# Patient Record
Sex: Male | Born: 1995 | Race: Black or African American | Hispanic: No | Marital: Single | Smoking: Never smoker
Health system: Southern US, Community
[De-identification: ages and names within clinical notes are randomized; demographics above are authoritative.]

## PROBLEM LIST (undated history)

## (undated) HISTORY — PX: ANTERIOR CRUCIATE LIGAMENT REPAIR: SHX115

---

## 2017-11-04 ENCOUNTER — Other Ambulatory Visit: Payer: Self-pay

## 2017-11-04 ENCOUNTER — Encounter (HOSPITAL_COMMUNITY): Payer: Self-pay | Admitting: Emergency Medicine

## 2017-11-04 ENCOUNTER — Ambulatory Visit (HOSPITAL_COMMUNITY)
Admission: EM | Admit: 2017-11-04 | Discharge: 2017-11-04 | Disposition: A | Discharge status: Home / Self Care | Payer: BLUE CROSS/BLUE SHIELD | Attending: Emergency Medicine | Admitting: Emergency Medicine

## 2017-11-04 DIAGNOSIS — Z202 Contact with and (suspected) exposure to infections with a predominantly sexual mode of transmission: Secondary | ICD-10-CM | POA: Diagnosis not present

## 2017-11-04 DIAGNOSIS — Z113 Encounter for screening for infections with a predominantly sexual mode of transmission: Secondary | ICD-10-CM

## 2017-11-04 LAB — POCT URINALYSIS DIP (DEVICE)
Bilirubin Urine: NEGATIVE
GLUCOSE, UA: NEGATIVE mg/dL
Ketones, ur: NEGATIVE mg/dL
NITRITE: NEGATIVE
Protein, ur: 30 mg/dL — AB
Specific Gravity, Urine: 1.02 (ref 1.005–1.030)
UROBILINOGEN UA: 2 mg/dL — AB (ref 0.0–1.0)
pH: 7 (ref 5.0–8.0)

## 2017-11-04 MED ORDER — AZITHROMYCIN 250 MG PO TABS
ORAL_TABLET | ORAL | Status: AC
  Filled 2017-11-04: qty 4

## 2017-11-04 MED ORDER — AZITHROMYCIN 250 MG PO TABS
1000.0000 mg | ORAL_TABLET | Freq: Once | ORAL | Status: AC
  Administered 2017-11-04: 1000 mg via ORAL

## 2017-11-04 NOTE — ED Provider Notes (Signed)
[facility]    CSN: [id number] Arrival date & time: 11/04/17  1556     History   Chief Complaint Chief Complaint  Patient presents with  . Exposure to STD    HPI Antonio Carroll is a 22 y.o. male presenting with concern over exposure to STD. A partner tested positive for chlamydia. Denies symptoms- penile discharge, rashes, lesions, dysuria, scrotal/testicular swelling or pain. No previous history of STD.  HPI  History reviewed. No pertinent past medical history.  There are no active problems to display for this patient.   History reviewed. No pertinent surgical history.     Home Medications    Prior to Admission medications   Not on File    Family History Family History  Family history unknown: Yes    Social History Social History   Tobacco Use  . Smoking status: Never Smoker  Substance Use Topics  . Alcohol use: Yes  . Drug use: Not on file     Allergies   Patient has no known allergies.   Review of Systems Review of Systems  Constitutional: Negative for fever.  HENT: Negative for sore throat.   Respiratory: Negative for shortness of breath.   Cardiovascular: Negative for chest pain.  Gastrointestinal: Negative for abdominal pain, nausea and vomiting.  Genitourinary: Negative for difficulty urinating, discharge, dysuria, frequency, penile pain, penile swelling, scrotal swelling and testicular pain.  Skin: Negative for rash.  Neurological: Negative for dizziness, light-headedness and headaches.     Physical Exam Triage Vital Signs ED Triage Vitals [11/04/17 1607]  Enc Vitals Group     BP (!) 117/56     Pulse Rate 69     Resp 18     Temp 98 F (36.7 C)     Temp src      SpO2 100 %     Weight      Height      Head Circumference      Peak Flow      Pain Score      Pain Loc      Pain Edu?      Excl. in GC?    No data found.  Updated Vital Signs BP (!) 117/56   Pulse 69   Temp 98 F (36.7 C)   Resp 18   SpO2 100%     Physical Exam  Constitutional: He is oriented to person, place, and time. He appears well-developed and well-nourished.  HENT:  Head: Normocephalic and atraumatic.  Eyes: Conjunctivae are normal.  Neck: Neck supple.  Cardiovascular: Normal rate.  Pulmonary/Chest: Effort normal. No respiratory distress.  Abdominal: He exhibits no distension.  Musculoskeletal: He exhibits no edema.  Neurological: He is alert and oriented to person, place, and time.  Skin: Skin is warm and dry.  Psychiatric: He has a normal mood and affect.  Nursing note and vitals reviewed.    UC Treatments / Results  Labs (all labs ordered are listed, but only abnormal results are displayed) Labs Reviewed  POCT URINALYSIS DIP (DEVICE)  URINE CYTOLOGY ANCILLARY ONLY    EKG  EKG Interpretation None       Radiology No results found.  Procedures Procedures (including critical care time)  Medications Ordered in UC Medications  azithromycin (ZITHROMAX) tablet 1,000 mg (not administered)     Initial Impression / Assessment and Plan / UC Course  I have reviewed the triage vital signs and the nursing notes.  Pertinent labs & imaging results that were available during  my care of the patient were reviewed by me and considered in my medical decision making (see chart for details).     Patient with exposure to chlamydia. Screening for STD's performed, will call to inform of positive results. Patient requesting empiric treatment today, chlamydia only; azithromycin given.    Discussed strict return precautions. Patient verbalized understanding and is agreeable with plan.    Final Clinical Impressions(s) / UC Diagnoses   Final diagnoses:  STD exposure    ED Discharge Orders    None       Controlled Substance Prescriptions  Controlled Substance Registry consulted? Not Applicable   Lew Dawes, [place] 11/04/17 1703

## 2017-11-04 NOTE — ED Notes (Signed)
Clean and dirty urine specimen obtained, specimens in lab

## 2017-11-04 NOTE — Discharge Instructions (Signed)
We have treated you today for chlamydia, with azithromycin. Please refrain from sexual activity for 7 days while medicine is clearing infection. ° °We are testing you for Gonorrhea, Chlamydia and Trichomonas. We will call you if anything is positive and let you know if you require any further treatment. Please inform partner of any positive results. ° °Please return if symptoms not improving with treatment, development of fever, nausea, vomiting, abdominal pain, scrotal pain. °

## 2017-11-04 NOTE — ED Triage Notes (Signed)
Pt states he was with someone who stated they had chlamydia. Denies symptoms.

## 2017-11-07 LAB — URINE CYTOLOGY ANCILLARY ONLY
Chlamydia: NEGATIVE
Neisseria Gonorrhea: NEGATIVE
TRICH (WINDOWPATH): NEGATIVE

## 2018-08-23 ENCOUNTER — Emergency Department (HOSPITAL_COMMUNITY)
Admission: EM | Admit: 2018-08-23 | Discharge: 2018-08-23 | Disposition: A | Discharge status: Home / Self Care | Payer: BLUE CROSS/BLUE SHIELD | Attending: Emergency Medicine | Admitting: Emergency Medicine

## 2018-08-23 ENCOUNTER — Emergency Department (HOSPITAL_COMMUNITY): Payer: BLUE CROSS/BLUE SHIELD

## 2018-08-23 ENCOUNTER — Encounter (HOSPITAL_COMMUNITY): Payer: Self-pay | Admitting: *Deleted

## 2018-08-23 DIAGNOSIS — Y92310 Basketball court as the place of occurrence of the external cause: Secondary | ICD-10-CM | POA: Insufficient documentation

## 2018-08-23 DIAGNOSIS — X500XXA Overexertion from strenuous movement or load, initial encounter: Secondary | ICD-10-CM | POA: Diagnosis not present

## 2018-08-23 DIAGNOSIS — S8991XA Unspecified injury of right lower leg, initial encounter: Secondary | ICD-10-CM | POA: Insufficient documentation

## 2018-08-23 DIAGNOSIS — Y9367 Activity, basketball: Secondary | ICD-10-CM | POA: Insufficient documentation

## 2018-08-23 DIAGNOSIS — Y998 Other external cause status: Secondary | ICD-10-CM | POA: Diagnosis not present

## 2018-08-23 DIAGNOSIS — M25561 Pain in right knee: Secondary | ICD-10-CM

## 2018-08-23 MED ORDER — ACETAMINOPHEN 500 MG PO TABS: 500.0000 mg | ORAL_TABLET | Freq: Four times a day (QID) | ORAL | 0 refills | Status: AC | PRN

## 2018-08-23 MED ORDER — IBUPROFEN 600 MG PO TABS: 600.0000 mg | ORAL_TABLET | Freq: Four times a day (QID) | ORAL | 0 refills | Status: AC | PRN

## 2018-08-23 NOTE — ED Triage Notes (Signed)
Pt complains of right knee pain since twisting his knee while playing basketball this afternoon.

## 2018-08-23 NOTE — ED Provider Notes (Signed)
[facility]-EMERGENCY DEPT Provider Note   CSN: [id number] Arrival date & time: 08/23/18  1848     History   Chief Complaint Chief Complaint  Patient presents with  . Knee Injury    HPI Antonio Carroll is a 22 y.o. male who is previously healthy who presents with right knee pain. He was play basketball and he thinks it twisted inward. He initially had some tingling to his knee, which has resolved. He has not been able to bear without significant pain since. He did not take any interventions prior to arrival.   HPI  History reviewed. No pertinent past medical history.  There are no active problems to display for this patient.   History reviewed. No pertinent surgical history.      Home Medications    Prior to Admission medications   Medication Sig Start Date End Date Taking? Authorizing Provider  acetaminophen (TYLENOL) 500 MG tablet Take 1 tablet (500 mg total) by mouth every 6 (six) hours as needed. 08/23/18   Avis Mcmahill, Waylan Boga, PA-C  ibuprofen (ADVIL,MOTRIN) 600 MG tablet Take 1 tablet (600 mg total) by mouth every 6 (six) hours as needed. 08/23/18   Emi Holes, PA-C    Family History Family History  Family history unknown: Yes    Social History Social History   Tobacco Use  . Smoking status: Never Smoker  Substance Use Topics  . Alcohol use: Yes  . Drug use: Not on file     Allergies   Patient has no known allergies.   Review of Systems Review of Systems  Constitutional: Negative for fever.  Musculoskeletal: Positive for arthralgias.  Neurological: Negative for numbness.     Physical Exam Updated Vital Signs BP 125/69 (BP Location: Right Arm)   Pulse (!) 57   Temp 98.3 F (36.8 C) (Oral)   Resp 18   SpO2 96%   Physical Exam  Constitutional: He appears well-developed and well-nourished. No distress.  HENT:  Head: Normocephalic and atraumatic.  Mouth/Throat: Oropharynx is clear and moist. No oropharyngeal exudate.   Eyes: Pupils are equal, round, and reactive to light. Conjunctivae are normal. Right eye exhibits no discharge. Left eye exhibits no discharge. No scleral icterus.  Neck: Normal range of motion. Neck supple. No thyromegaly present.  Cardiovascular: Normal rate, regular rhythm, normal heart sounds and intact distal pulses. Exam reveals no gallop and no friction rub.  No murmur heard. Pulmonary/Chest: Effort normal and breath sounds normal. No stridor. No respiratory distress. He has no wheezes. He has no rales.  Musculoskeletal: He exhibits no edema.  R knee: Tenderness over the patella and patellar tendon, SLR intact, negative anterior/posterior drawer, no laxity with varus and valgus stress, mild edema over prepatellar space, no warmth or erythema, 5/5 strength with flexion and extension  Lymphadenopathy:    He has no cervical adenopathy.  Neurological: He is alert. Coordination normal.  Skin: Skin is warm and dry. No rash noted. He is not diaphoretic. No pallor.  Psychiatric: He has a normal mood and affect.  Nursing note and vitals reviewed.    ED Treatments / Results  Labs (all labs ordered are listed, but only abnormal results are displayed) Labs Reviewed - No data to display  EKG None  Radiology Dg Knee Complete 4 Views Right  Result Date: 08/23/2018 CLINICAL DATA:  Pt complains of right knee pain since twisting his knee while playing basketball this afternoon. EXAM: RIGHT KNEE - COMPLETE 4+ VIEW COMPARISON:  None. FINDINGS: No  acute fracture or dislocation. Limited evaluation for joint effusion, likely due to patient size and overlying soft tissues. Joint spaces are maintained. IMPRESSION: No acute osseous abnormality. Electronically Signed   By: Jeronimo Greaves M.D.   On: 08/23/2018 19:44    Procedures Procedures (including critical care time)  Medications Ordered in ED Medications - No data to display   Initial Impression / Assessment and Plan / ED Course  I have  reviewed the triage vital signs and the nursing notes.  Pertinent labs & imaging results that were available during my care of the patient were reviewed by me and considered in my medical decision making (see chart for details).     Patient presenting with R knee pain after twisting it while playing basketball. X-ray is negative for acute injury. The joint appears stable. Patellar tendon appears intact. Will refer to orthopedics for further evaluation for probably soft tissue injury. Supportive treatment discussed including ice, elevation, ibuprofen, Tylenol. ACE wrap and crutches provided in the ED. Return precautions discussed. Patient understands and agrees with plan. Patient vitals stable throughout ED course and discharged in satisfactory condition.  Final Clinical Impressions(s) / ED Diagnoses   Final diagnoses:  Acute pain of right knee    ED Discharge Orders         Ordered    ibuprofen (ADVIL,MOTRIN) 600 MG tablet  Every 6 hours PRN     08/23/18 2056    acetaminophen (TYLENOL) 500 MG tablet  Every 6 hours PRN     08/23/18 2056           Emi Holes, PA-C 08/23/18 2308    Arby Barrette, MD 08/24/18 1255

## 2018-08-23 NOTE — Discharge Instructions (Addendum)
Use ice 3-4 times daily alternating 20 minutes on, 20 minutes off.  Please elevate your leg whenever you are not walking on it.  Take ibuprofen and Tylenol as prescribed, as needed for your pain.  Please follow-up with the orthopedic doctor for further evaluation and treatment of your knee pain, as you may have a soft tissue injury that was not seen on x-ray today.

## 2019-11-15 IMAGING — CR DG KNEE COMPLETE 4+V*R*
4 series · 4 of 4 positions shown · non-contrast
Comparison: None.

CLINICAL DATA: Pt complains of right knee pain since twisting his
knee while playing basketball this afternoon.

EXAM:
RIGHT KNEE - COMPLETE 4+ VIEW

[t knee ap right]
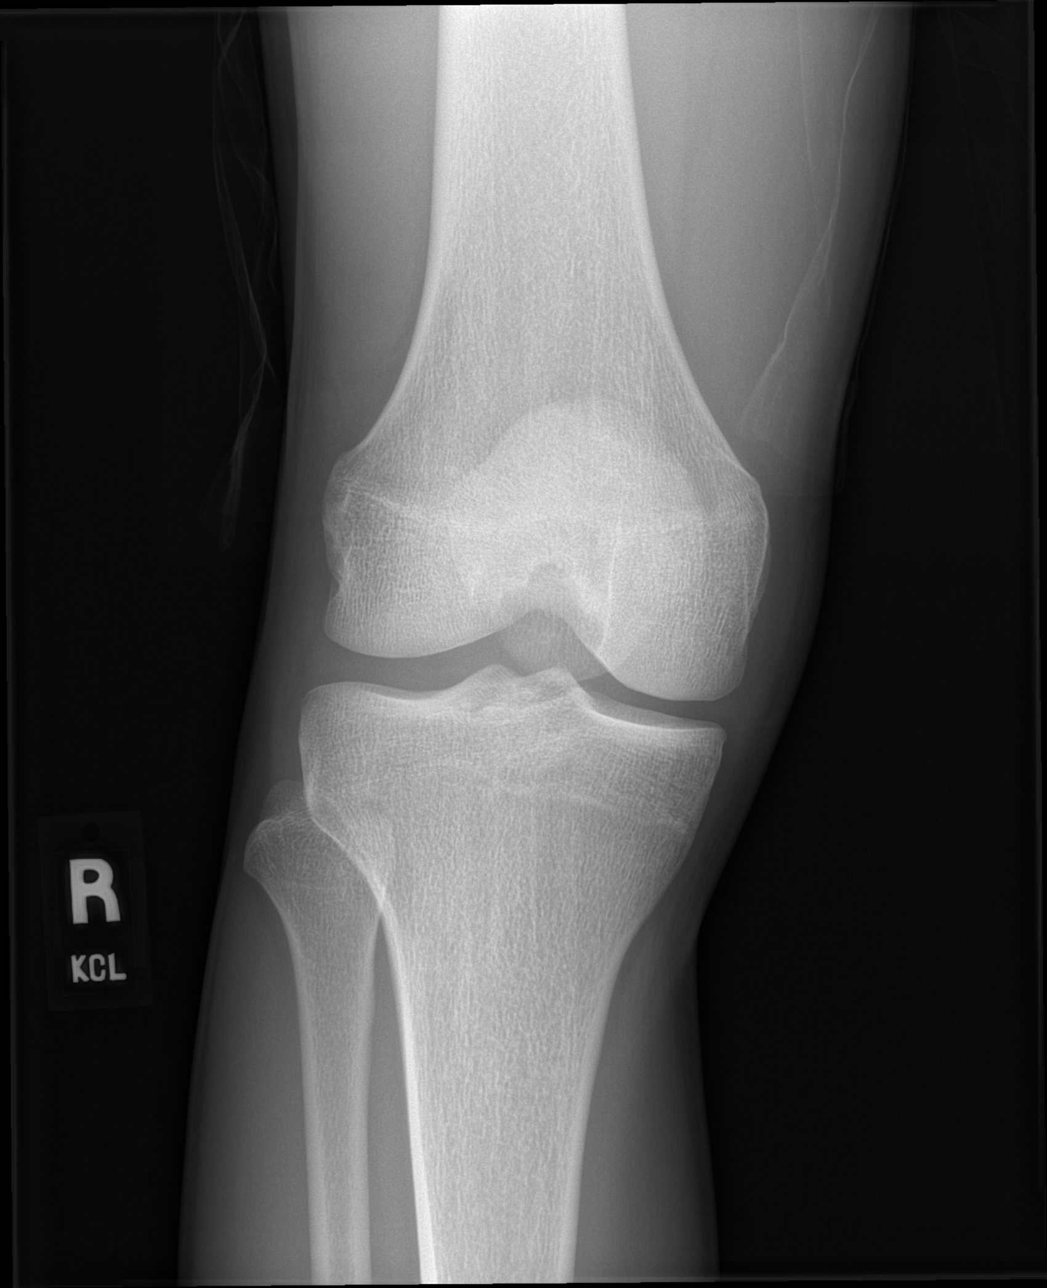

[t knee obl right (1 of 2)]
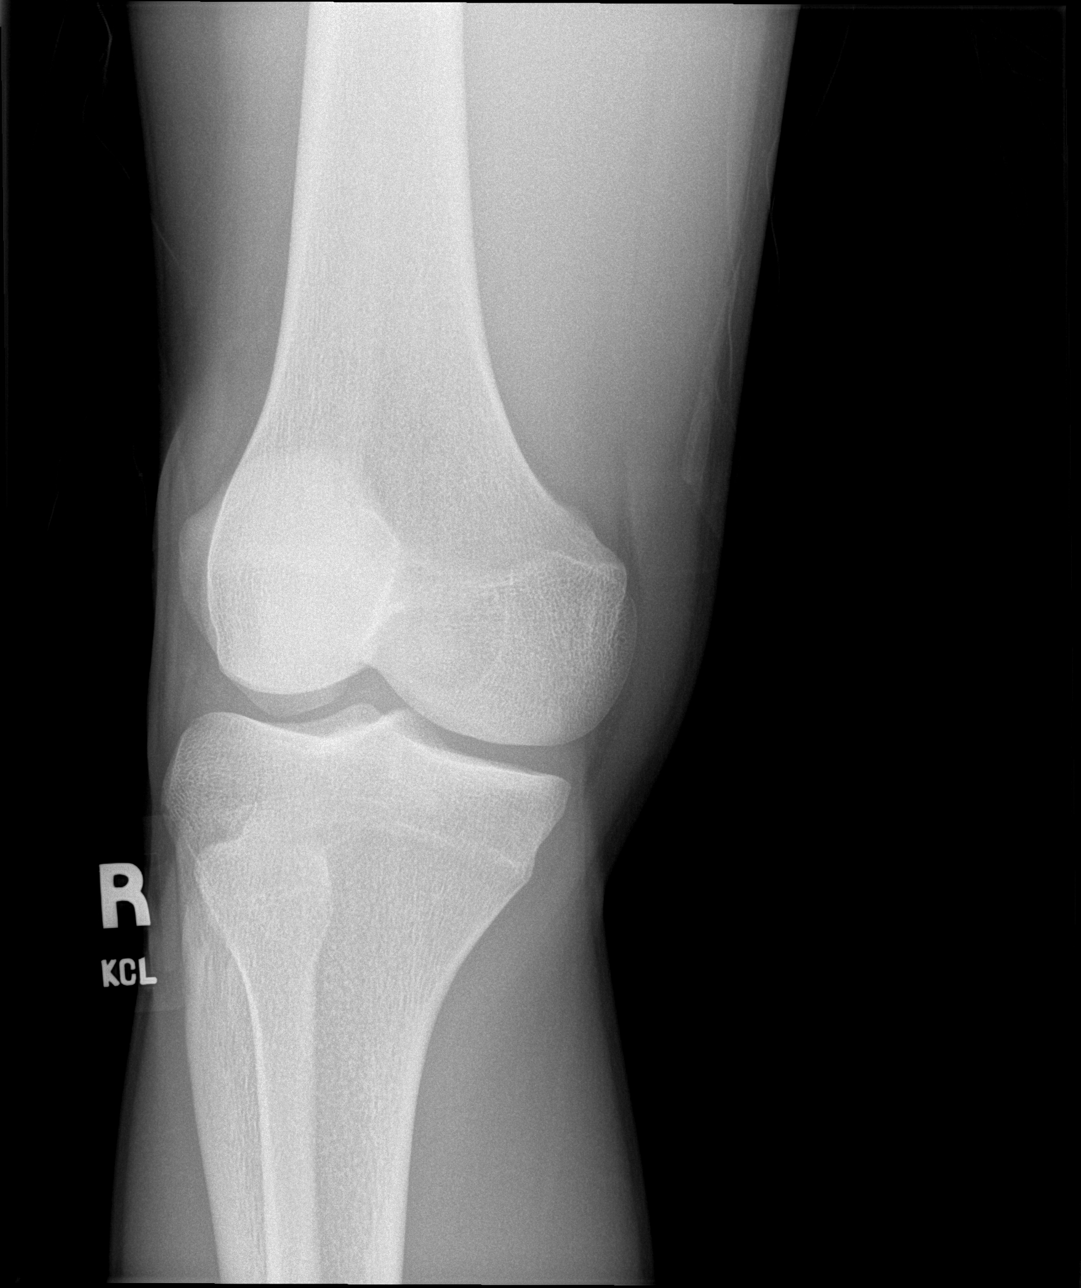

[t knee obl right (2 of 2)]
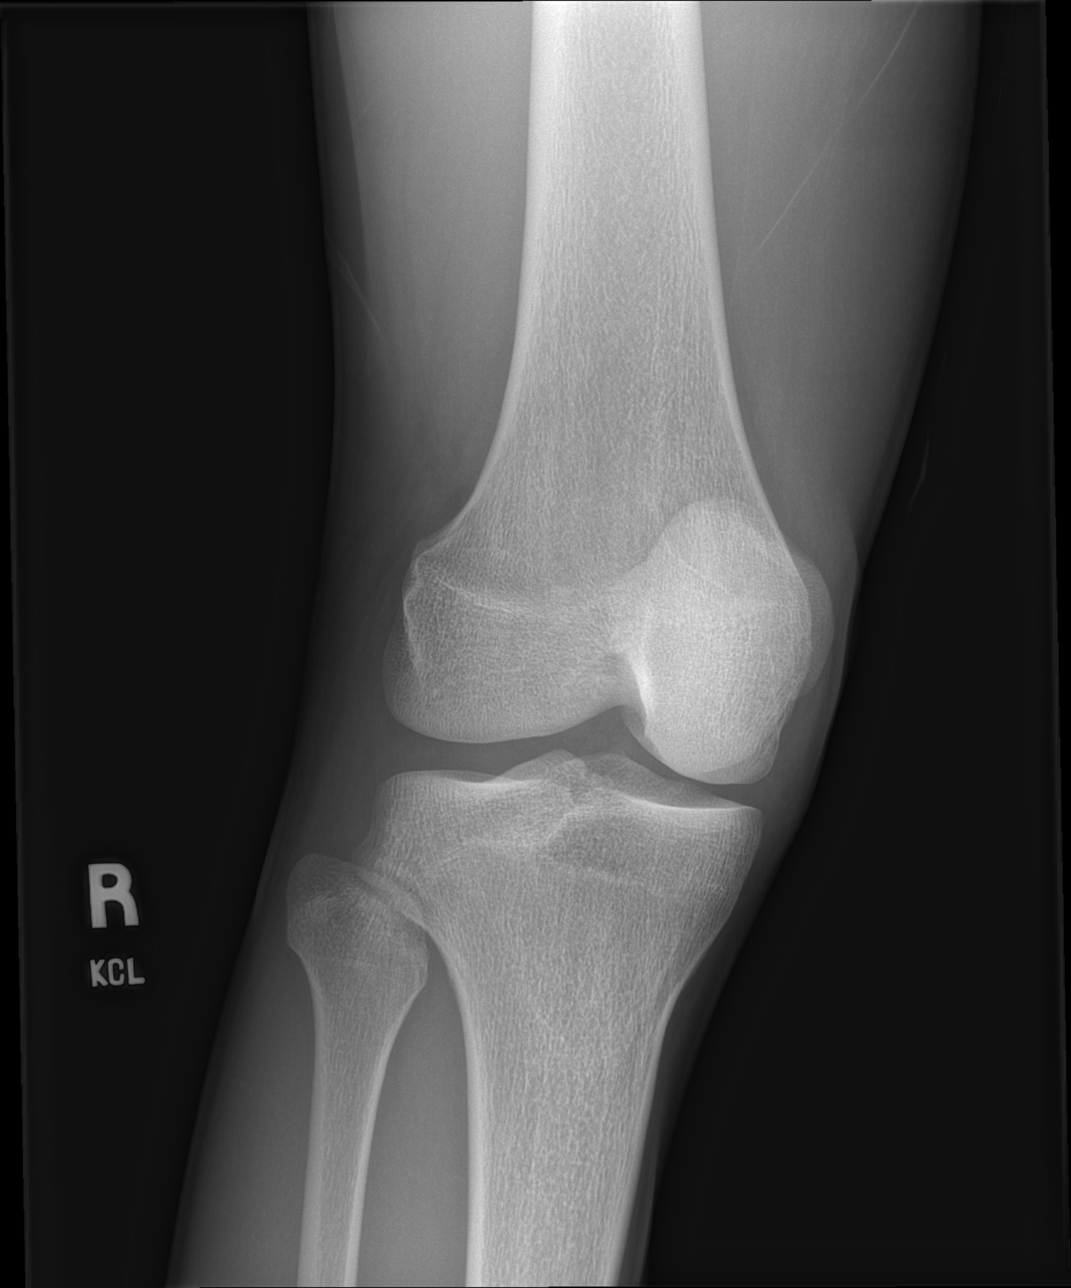

[t knee lat right]
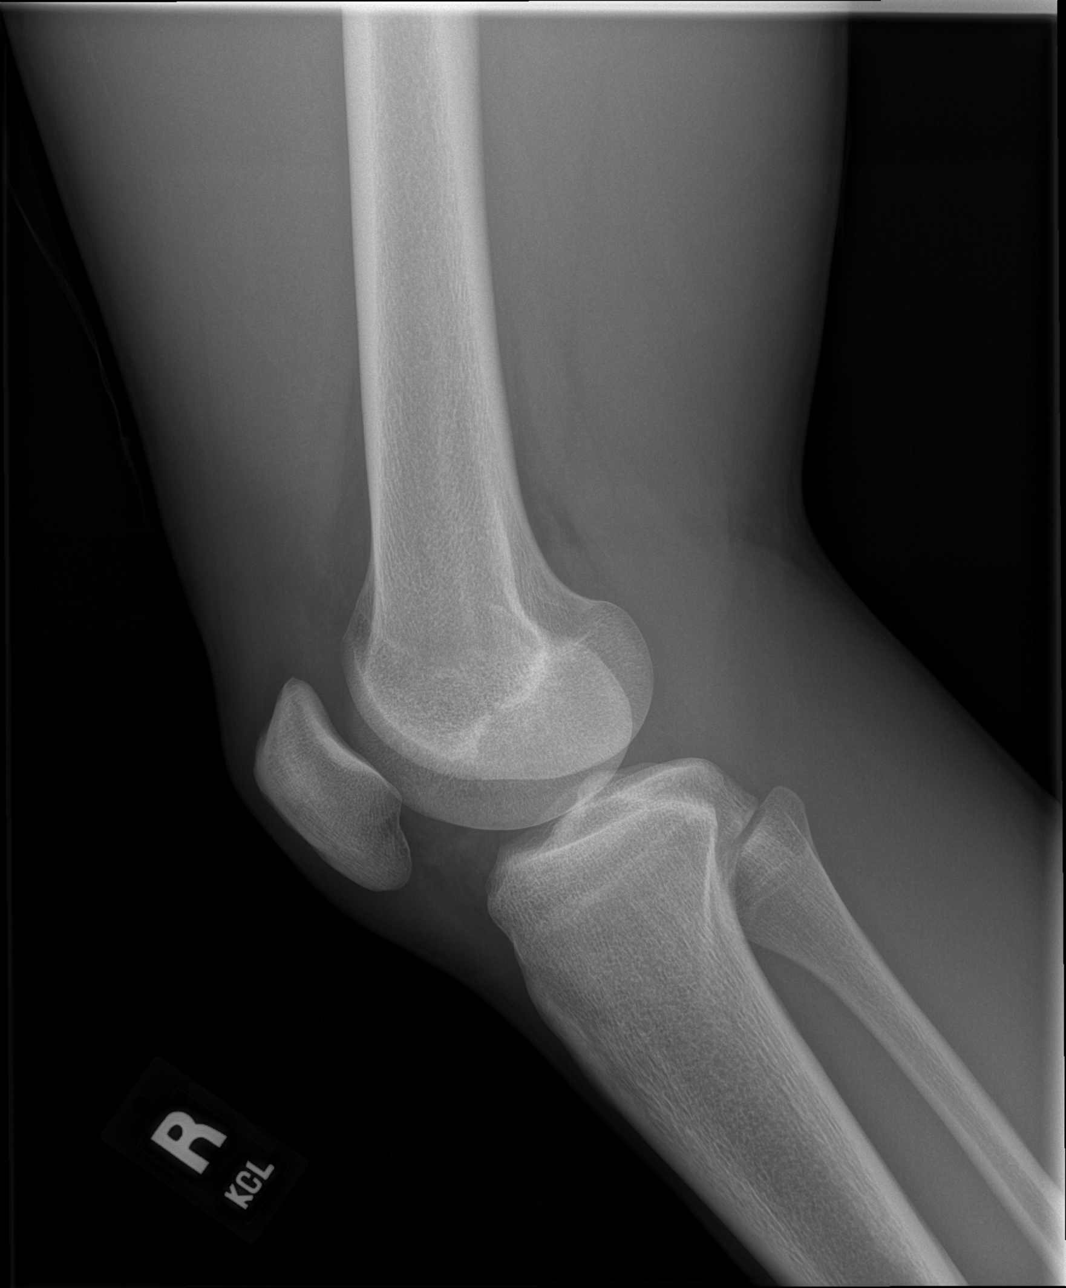

[4 of 4 positions shown; findings below may reference images not displayed]

FINDINGS: No acute fracture or dislocation. Limited evaluation for joint
effusion, likely due to patient size and overlying soft tissues.
Joint spaces are maintained.
IMPRESSION: No acute osseous abnormality.

## 2023-02-08 ENCOUNTER — Emergency Department (HOSPITAL_BASED_OUTPATIENT_CLINIC_OR_DEPARTMENT_OTHER)
Admission: EM | Admit: 2023-02-08 | Discharge: 2023-02-08 | Disposition: A | Discharge status: Home / Self Care | Payer: BLUE CROSS/BLUE SHIELD | Attending: Emergency Medicine | Admitting: Emergency Medicine

## 2023-02-08 ENCOUNTER — Encounter (HOSPITAL_BASED_OUTPATIENT_CLINIC_OR_DEPARTMENT_OTHER): Payer: Self-pay

## 2023-02-08 DIAGNOSIS — R350 Frequency of micturition: Secondary | ICD-10-CM | POA: Insufficient documentation

## 2023-02-08 LAB — URINALYSIS, ROUTINE W REFLEX MICROSCOPIC
Bilirubin Urine: NEGATIVE
Glucose, UA: NEGATIVE mg/dL
Hgb urine dipstick: NEGATIVE
Ketones, ur: NEGATIVE mg/dL
Leukocytes,Ua: NEGATIVE
Nitrite: NEGATIVE
Protein, ur: NEGATIVE mg/dL
Specific Gravity, Urine: 1.025 (ref 1.005–1.030)
pH: 7 (ref 5.0–8.0)

## 2023-02-08 LAB — CBG MONITORING, ED: Glucose-Capillary: 73 mg/dL (ref 70–99)

## 2023-02-08 NOTE — ED Provider Notes (Signed)
[facility] Provider Note   CSN: [id number] Arrival date & time: 02/08/23  4098     History  Chief Complaint  Patient presents with   Urinary Frequency    Antonio Carroll is a 27 y.o. male.  Patient here with urinary frequency.  Does not have any specific concern for an STD but like to get checked for it.  He denies any testicle pain or penile lesions or discharge.  Denies history of diabetes.  Denies any fevers or chills.  No abdominal pain.  Symptoms started yesterday.  The history is provided by the patient.       Home Medications Prior to Admission medications   Medication Sig Start Date End Date Taking? Authorizing Provider  acetaminophen (TYLENOL) 500 MG tablet Take 1 tablet (500 mg total) by mouth every 6 (six) hours as needed. 08/23/18   Law, Waylan Boga, PA-C  ibuprofen (ADVIL,MOTRIN) 600 MG tablet Take 1 tablet (600 mg total) by mouth every 6 (six) hours as needed. 08/23/18   Emi Holes, PA-C      Allergies    Patient has no known allergies.    Review of Systems   Review of Systems  Physical Exam Updated Vital Signs BP (!) 129/92 (BP Location: Left Arm)   Pulse 60   Temp 97.8 F (36.6 C) (Oral)   Resp 18   Ht 6' (1.829 m)   Wt 76.2 kg   SpO2 100%   BMI 22.78 kg/m  Physical Exam Vitals and nursing note reviewed.  Constitutional:      General: He is not in acute distress.    Appearance: He is well-developed.  HENT:     Head: Normocephalic and atraumatic.  Eyes:     Extraocular Movements: Extraocular movements intact.     Conjunctiva/sclera: Conjunctivae normal.     Pupils: Pupils are equal, round, and reactive to light.  Cardiovascular:     Rate and Rhythm: Normal rate and regular rhythm.     Heart sounds: No murmur heard. Pulmonary:     Effort: Pulmonary effort is normal. No respiratory distress.     Breath sounds: Normal breath sounds.  Abdominal:     Palpations: Abdomen is soft.      Tenderness: There is no abdominal tenderness.  Genitourinary:    Comments: Deferred per patient preference Musculoskeletal:        General: No swelling.     Cervical back: Neck supple.  Skin:    General: Skin is warm and dry.     Capillary Refill: Capillary refill takes less than 2 seconds.  Neurological:     Mental Status: He is alert.  Psychiatric:        Mood and Affect: Mood normal.     ED Results / Procedures / Treatments   Labs (all labs ordered are listed, but only abnormal results are displayed) Labs Reviewed  URINALYSIS, ROUTINE W REFLEX MICROSCOPIC  CBG MONITORING, ED  GC/CHLAMYDIA PROBE AMP ([place]) NOT AT [facility]    EKG None  Radiology No results found.  Procedures Procedures    Medications Ordered in ED Medications - No data to display  ED Course/ Medical Decision Making/ A&P                             Medical Decision Making Amount and/or Complexity of Data Reviewed Labs: ordered.   Jaymarion Trombly is here with urinary frequency.  Normal vitals.  No fever.  Differential diagnosis is STD versus UTI versus less likely diabetes problem/new diabetes.  Possible that this could be due to poor p.o. intake.  Urinalysis was collected and is unremarkable per my review and interpretation.  Blood sugarWithin normal limits.  Offered syphilis and HIV testing but he declined.  Overall recommend increased hydration.  He will follow-up gonorrhea and Chlamydia test and on his MyChart.  Discharged in good condition.  This chart was dictated using voice recognition software.  Despite best efforts to proofread,  errors can occur which can change the documentation meaning.         Final Clinical Impression(s) / ED Diagnoses Final diagnoses:  Urinary frequency    Rx / DC Orders ED Discharge Orders     None         Virgina Norfolk, DO 02/08/23 [phone]

## 2023-02-08 NOTE — ED Notes (Signed)
ED Provider at bedside. 

## 2023-02-08 NOTE — ED Triage Notes (Signed)
Pt reports to ED with increased urgency to urinate. States he has a weird feeling after he urinates.

## 2023-02-08 NOTE — Discharge Instructions (Addendum)
Urinalysis is negative for infection.  Your blood sugar is normal.  This could be due to some dehydration.  Recommend that you drink plenty of fluids today.  I would abstain from any sexual activity until your gonorrhea and Chlamydia tests have resulted.  This should result likely tomorrow.  Follow-up on your MyChart.  Please seek treatment at health department or return to the ED for treatment if your test is positive.  We will contact you with the results as well if they are positive.  If your problem is persistent and you have any further concerns I have given you information to follow-up with urologist if needed.

## 2023-02-09 LAB — GC/CHLAMYDIA PROBE AMP (~~LOC~~) NOT AT ARMC
Chlamydia: NEGATIVE
Comment: NEGATIVE
Comment: NORMAL
Neisseria Gonorrhea: NEGATIVE

## 2024-04-15 ENCOUNTER — Other Ambulatory Visit: Payer: Self-pay

## 2024-04-15 ENCOUNTER — Emergency Department
Admission: EM | Admit: 2024-04-15 | Discharge: 2024-04-15 | Disposition: A | Discharge status: Home / Self Care | Attending: Emergency Medicine | Admitting: Emergency Medicine

## 2024-04-15 DIAGNOSIS — H5789 Other specified disorders of eye and adnexa: Secondary | ICD-10-CM | POA: Insufficient documentation

## 2024-04-15 MED ORDER — TETRACAINE HCL 0.5 % OP SOLN
1.0000 [drp] | Freq: Once | OPHTHALMIC | Status: AC
  Administered 2024-04-15: 2 [drp] via OPHTHALMIC
  Filled 2024-04-15: qty 4

## 2024-04-15 MED ORDER — FLUORESCEIN SODIUM 1 MG OP STRP
1.0000 | ORAL_STRIP | Freq: Once | OPHTHALMIC | Status: AC
  Administered 2024-04-15: 1 via OPHTHALMIC
  Filled 2024-04-15: qty 1

## 2024-04-15 MED ORDER — TOBRAMYCIN 0.3 % OP SOLN: 1.0000 [drp] | OPHTHALMIC | 0 refills | Status: AC

## 2024-04-15 NOTE — ED Triage Notes (Signed)
 Pt to ED for irritation and redness to both eyes since Monday. No pain or itching. Eyes feel like sand in them.

## 2024-04-15 NOTE — ED Provider Notes (Signed)
 [facility] Emergency Department Provider Note     Event Date/Time   First MD Initiated Contact with Patient 04/15/24 1541     (approximate)   History   Eye Problem   HPI  Antonio Carroll is a 28 y.o. male with no significant past medical history presents to the ED for evaluation of bilateral eye irritation x 6 days.  Reports on Monday he was spraying bug spray to kill ants in his home when he believes he may have made contact to his eye.  [facility] and [facility] erythromycin ointment but reports no improvement.  Denies visual changes, fever, discharge or trauma.  He does endorse feeling as if there is something in his eye such as a pebble.  Does not wear contact lenses.     Physical Exam   Triage Vital Signs: ED Triage Vitals [04/15/24 1538]  Encounter Vitals Group     BP (!) 136/99     Girls Systolic BP Percentile      Girls Diastolic BP Percentile      Boys Systolic BP Percentile      Boys Diastolic BP Percentile      Pulse Rate (!) 58     Resp 16     Temp 98 F (36.7 C)     Temp Source Oral     SpO2 100 %     Weight 181 lb (82.1 kg)     Height 6' (1.829 m)     Head Circumference      Peak Flow      Pain Score 0     Pain Loc      Pain Education      Exclude from Growth Chart     Most recent vital signs: Vitals:   04/15/24 1538  BP: (!) 136/99  Pulse: (!) 58  Resp: 16  Temp: 98 F (36.7 C)  SpO2: 100%    General Awake, no distress.  Nontoxic-appearing. HEENT NCAT. PERRL. EOMI without pain.  Bilateral conjunctival injection.  No chemosis, discharge or lesions noted.  No eyelid abnormality. CV:  Good peripheral perfusion.  RESP:  Normal effort.  ABD:  No distention.  Other:  Fluorescein stain test performed and revealed no corneal abrasion, foreign body or abnormality of bilateral orbits.  Right IOP - . left IOP - 12 mmHg   ED Results / Procedures / Treatments   Labs (all labs ordered are listed, but  only abnormal results are displayed) Labs Reviewed - No data to display  No results found.  PROCEDURES:  Critical Care performed: No  Procedures   MEDICATIONS ORDERED IN ED: Medications  tetracaine (PONTOCAINE) 0.5 % ophthalmic solution 1-2 drop (has no administration in time range)  fluorescein ophthalmic strip 1 strip (has no administration in time range)     IMPRESSION / MDM / ASSESSMENT AND PLAN / ED COURSE  I reviewed the triage vital signs and the nursing notes.                               28 y.o. male presents to the emergency department for evaluation and treatment of bilateral eye irritation. See HPI for further details.   Differential diagnosis includes, but is not limited to corneal abrasion, bacterial /viral conjunctivitis, chemical conjunctivitis, foreign body   Patient's presentation is most consistent with acute complicated illness / injury requiring diagnostic workup.  Patient is alert and oriented.  He is hemodynamically stable.  Physical exam findings are as stated above and pertinent for bilateral conjunctival injection.  Fluorescein test performed and no evidence of corneal abrasion or foreign body.  Normal IOP levels.  Patient initially states improvement following tetracaine administration.  Prior to discharge patient reports still feeling like there is a  pebble in his left eye.  Morgan's lens for ocular irrigation administered.  Advise close follow-up with ophthalmology for further evaluation.  In the interim, encouraged alternation between warm and cool compresses and NSAID for pain control.  Patient is agreement with this care plan.  Will send antibiotic eyedrops to pharmacy.  Patient stable condition for discharge home.   FINAL CLINICAL IMPRESSION(S) / ED DIAGNOSES   Final diagnoses:  Eye irritation   Rx / DC Orders   ED Discharge Orders          Ordered    tobramycin (TOBREX) 0.3 % ophthalmic solution  Every 4 hours        04/15/24 1618            Note:  This document was prepared using Dragon voice recognition software and may include unintentional dictation errors.    Margrette, Lizann Edelman A, PA-C 04/15/24 1639    Waymond Lorelle Cummins, MD 04/15/24 [phone]

## 2024-04-15 NOTE — Discharge Instructions (Addendum)
 You were evaluated in the ED for eye irritation.  Please pick up prescription eyedrops from your pharmacy.  Flush the eye with clean, warm water daily.  Apply warm compresses to relieve discomfort you can alternate with cool compresses to reduce itching.  Wash hands frequently to to prevent spread of infection.  If symptoms persist longer than a week follow-up with ophthalmology.
